# Patient Record
Sex: Male | Born: 1995 | Race: Black or African American | Hispanic: No | Marital: Single
Health system: Southern US, Community
[De-identification: ages and names within clinical notes are randomized; demographics above are authoritative.]

## PROBLEM LIST (undated history)

## (undated) ENCOUNTER — Ambulatory Visit: Source: Home / Self Care

## (undated) ENCOUNTER — Ambulatory Visit

## (undated) ENCOUNTER — Ambulatory Visit: Admission: EM | Payer: Medicaid Other | Source: Home / Self Care

## (undated) ENCOUNTER — Emergency Department (HOSPITAL_COMMUNITY): Payer: Self-pay | Source: Home / Self Care

## (undated) ENCOUNTER — Emergency Department (HOSPITAL_COMMUNITY)

---
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
## 2019-12-28 ENCOUNTER — Ambulatory Visit: Payer: Medicaid Other | Attending: Internal Medicine

## 2019-12-28 DIAGNOSIS — Z20822 Contact with and (suspected) exposure to covid-19: Secondary | ICD-10-CM

## 2019-12-29 LAB — NOVEL CORONAVIRUS, NAA: SARS-CoV-2, NAA: DETECTED — AB

## 2019-12-31 ENCOUNTER — Other Ambulatory Visit: Payer: Self-pay

## 2020-01-15 ENCOUNTER — Ambulatory Visit: Payer: Medicaid Other | Attending: Internal Medicine

## 2020-01-15 DIAGNOSIS — Z20822 Contact with and (suspected) exposure to covid-19: Secondary | ICD-10-CM

## 2020-01-16 LAB — NOVEL CORONAVIRUS, NAA: SARS-CoV-2, NAA: NOT DETECTED

## 2020-06-01 ENCOUNTER — Emergency Department (HOSPITAL_COMMUNITY)
Admission: EM | Admit: 2020-06-01 | Discharge: 2020-06-02 | Disposition: A | Discharge status: Home / Self Care | Payer: No Typology Code available for payment source | Attending: Emergency Medicine | Admitting: Emergency Medicine

## 2020-06-01 ENCOUNTER — Encounter (HOSPITAL_COMMUNITY): Payer: Self-pay | Admitting: Emergency Medicine

## 2020-06-01 ENCOUNTER — Other Ambulatory Visit: Payer: Self-pay

## 2020-06-01 ENCOUNTER — Emergency Department (HOSPITAL_COMMUNITY): Payer: No Typology Code available for payment source

## 2020-06-01 DIAGNOSIS — R519 Headache, unspecified: Secondary | ICD-10-CM | POA: Insufficient documentation

## 2020-06-01 DIAGNOSIS — M545 Low back pain, unspecified: Secondary | ICD-10-CM

## 2020-06-01 DIAGNOSIS — S50311A Abrasion of right elbow, initial encounter: Secondary | ICD-10-CM | POA: Insufficient documentation

## 2020-06-01 DIAGNOSIS — S59901A Unspecified injury of right elbow, initial encounter: Secondary | ICD-10-CM | POA: Diagnosis present

## 2020-06-01 DIAGNOSIS — Y998 Other external cause status: Secondary | ICD-10-CM | POA: Insufficient documentation

## 2020-06-01 DIAGNOSIS — Y92410 Unspecified street and highway as the place of occurrence of the external cause: Secondary | ICD-10-CM | POA: Insufficient documentation

## 2020-06-01 DIAGNOSIS — R079 Chest pain, unspecified: Secondary | ICD-10-CM | POA: Diagnosis not present

## 2020-06-01 DIAGNOSIS — M79632 Pain in left forearm: Secondary | ICD-10-CM

## 2020-06-01 DIAGNOSIS — M25571 Pain in right ankle and joints of right foot: Secondary | ICD-10-CM

## 2020-06-01 DIAGNOSIS — Y9389 Activity, other specified: Secondary | ICD-10-CM | POA: Diagnosis not present

## 2020-06-01 MED ORDER — NAPROXEN 500 MG PO TABS
500.0000 mg | ORAL_TABLET | Freq: Two times a day (BID) | ORAL | 0 refills | Status: DC
Start: 2020-06-01 — End: 2022-03-19

## 2020-06-01 MED ORDER — BACITRACIN ZINC 500 UNIT/GM EX OINT
TOPICAL_OINTMENT | Freq: Two times a day (BID) | CUTANEOUS | Status: DC
  Administered 2020-06-01: 1 via TOPICAL
  Filled 2020-06-01: qty 0.9

## 2020-06-01 MED ORDER — IBUPROFEN 800 MG PO TABS
800.0000 mg | ORAL_TABLET | Freq: Once | ORAL | Status: AC
  Administered 2020-06-01: 800 mg via ORAL
  Filled 2020-06-01: qty 1

## 2020-06-01 NOTE — Discharge Instructions (Signed)
Take naproxen 2 times a day with meals.  Do not take other anti-inflammatories at the same time (Advil, Motrin, ibuprofen, Aleve). You may supplement with Tylenol if you need further pain control. Use ice packs or heating pads if this helps control your pain. You will likely have continued muscle stiffness and soreness over the next couple days.  Follow-up with primary care in 1 week if your symptoms are not improving. Return to the emergency room if you develop vision changes, vomiting, slurred speech, numbness, loss of bowel or bladder control, or any new or worsening symptoms.
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
## 2020-09-22 ENCOUNTER — Other Ambulatory Visit: Payer: Medicaid Other

## 2021-01-06 ENCOUNTER — Ambulatory Visit (INDEPENDENT_AMBULATORY_CARE_PROVIDER_SITE_OTHER): Payer: Medicaid Other | Admitting: Primary Care

## 2021-02-15 ENCOUNTER — Encounter (HOSPITAL_COMMUNITY): Payer: Self-pay | Admitting: Emergency Medicine

## 2021-02-15 ENCOUNTER — Other Ambulatory Visit: Payer: Self-pay

## 2021-02-15 ENCOUNTER — Emergency Department (HOSPITAL_COMMUNITY)
Admission: EM | Admit: 2021-02-15 | Discharge: 2021-02-15 | Disposition: A | Discharge status: Home / Self Care | Payer: Medicaid Other | Attending: Emergency Medicine | Admitting: Emergency Medicine

## 2021-02-15 DIAGNOSIS — Z202 Contact with and (suspected) exposure to infections with a predominantly sexual mode of transmission: Secondary | ICD-10-CM | POA: Diagnosis not present

## 2021-02-15 DIAGNOSIS — Z711 Person with feared health complaint in whom no diagnosis is made: Secondary | ICD-10-CM

## 2021-02-15 LAB — URINALYSIS, ROUTINE W REFLEX MICROSCOPIC
Bacteria, UA: NONE SEEN
Glucose, UA: NEGATIVE mg/dL
Hgb urine dipstick: NEGATIVE
Ketones, ur: 5 mg/dL — AB
Leukocytes,Ua: NEGATIVE
Nitrite: NEGATIVE
Protein, ur: 30 mg/dL — AB
Specific Gravity, Urine: 1.042 — ABNORMAL HIGH (ref 1.005–1.030)
pH: 5 (ref 5.0–8.0)

## 2021-02-15 LAB — HIV ANTIBODY (ROUTINE TESTING W REFLEX): HIV Screen 4th Generation wRfx: NONREACTIVE

## 2021-02-15 NOTE — ED Provider Notes (Signed)
[facility]-EMERGENCY DEPT Provider Note   CSN: [id number] Arrival date & time: 02/15/21  1157     History Chief Complaint  Patient presents with  . Exposure to STD    Michael Swanson is a 25 y.o. male.  HPI   Patient presents today for STD testing.  Patient states he is currently having receptive anal intercourse with 2 other men.  Denies using protection.  One of his current partner states that he tested positive for an STD but patient is unsure of which STD this was.  Denies any symptoms at this time.  No chest pain, shortness of breath, abdominal pain, penile pain, testicular pain, penile swelling, testicular swelling, dysuria, hematuria, rectal pain, rectal discharge.     History reviewed. No pertinent past medical history.  There are no problems to display for this patient.   History reviewed. No pertinent surgical history.    History reviewed. No pertinent family history.  Social History   Tobacco Use  . Smoking status: Never Smoker  . Smokeless tobacco: Never Used  Substance Use Topics  . Alcohol use: Yes  . Drug use: Not Currently    Types: Marijuana    Home Medications Prior to Admission medications   Medication Sig Start Date End Date Taking? Authorizing Provider  naproxen (NAPROSYN) 500 MG tablet Take 1 tablet (500 mg total) by mouth 2 (two) times daily with a meal. 06/01/20   Caccavale, Sophia, PA-C    Allergies    Patient has no known allergies.  Review of Systems   Review of Systems  Respiratory: Negative for shortness of breath.   Cardiovascular: Negative for chest pain.  Gastrointestinal: Negative for abdominal pain, nausea and vomiting.  Genitourinary: Negative for decreased urine volume, difficulty urinating, dysuria, frequency, genital sores, hematuria, penile discharge, penile pain, penile swelling, scrotal swelling, testicular pain and urgency.   Physical Exam Updated Vital Signs BP (!) 148/71 (BP Location:  Right Arm)   Pulse 71   Temp 98.1 F (36.7 C) (Oral)   Resp 16   SpO2 98%   Physical Exam Vitals and nursing note reviewed.  Constitutional:      General: He is not in acute distress.    Appearance: He is well-developed.  HENT:     Head: Normocephalic and atraumatic.     Right Ear: External ear normal.     Left Ear: External ear normal.  Eyes:     General: No scleral icterus.       Right eye: No discharge.        Left eye: No discharge.     Conjunctiva/sclera: Conjunctivae normal.  Neck:     Trachea: No tracheal deviation.  Cardiovascular:     Rate and Rhythm: Normal rate.  Pulmonary:     Effort: Pulmonary effort is normal. No respiratory distress.     Breath sounds: No stridor.  Abdominal:     General: There is no distension.  Musculoskeletal:        General: No swelling or deformity.     Cervical back: Neck supple.  Skin:    General: Skin is warm and dry.     Findings: No rash.  Neurological:     Mental Status: He is alert.     Cranial Nerves: Cranial nerve deficit: no gross deficits.    ED Results / Procedures / Treatments   Labs (all labs ordered are listed, but only abnormal results are displayed) Labs Reviewed  URINALYSIS, ROUTINE W REFLEX MICROSCOPIC -  Abnormal; Notable for the following components:      Result Value   Color, Urine AMBER (*)    Specific Gravity, Urine 1.042 (*)    Bilirubin Urine SMALL (*)    Ketones, ur 5 (*)    Protein, ur 30 (*)    All other components within normal limits  HIV ANTIBODY (ROUTINE TESTING W REFLEX)  GC/CHLAMYDIA PROBE AMP ([place]) NOT AT [facility]   EKG None  Radiology No results found.  Procedures Procedures   Medications Ordered in ED Medications - No data to display  ED Course  I have reviewed the triage vital signs and the nursing notes.  Pertinent labs & imaging results that were available during my care of the patient were reviewed by me and considered in my medical decision making (see chart for  details).    MDM Rules/Calculators/A&P                          Patient is a 25 year old male who presents the emergency department for STD testing.  Currently asymptomatic.  Patient reports a history of receptive anal intercourse with 2 male partners.  Denies using protection.  UA reassuring.  HIV testing is pending.  GC/chlamydia is pending.  Patient is going to check the results of these tests on MyChart.  Recommended that he refrain from sex for the next 2 weeks and be sure to check these results.  Recommended condom usage and safe sex practices in the future.  Follow-up testing in the health department.  Discussed return precautions.  He knows that he needs to return to the emergency department for treatment if his tests are positive.  He verbalized understanding of the above plan.  His questions were answered and he was amicable at the time of discharge.  Final Clinical Impression(s) / ED Diagnoses Final diagnoses:  Concern about STD in male without diagnosis   Rx / DC Orders ED Discharge Orders    None       Placido Sou, PA-C 02/15/21 1538    Virgina Norfolk, DO 02/16/21 [phone]

## 2021-02-15 NOTE — Discharge Instructions (Addendum)
Like we discussed, please check the results of your HIV test as well as your gonorrhea and chlamydia test on MyChart.  You can access this information online.  This should be available in the next 1 to 2 days.  Please make sure that you refrain from having sex for the next 2 weeks.  If any of your results are positive, please return to the emergency department should you can be reevaluated as well as treated.  It was a pleasure to meet you.

## 2021-02-15 NOTE — ED Triage Notes (Addendum)
Patient reports a possible exposure to unknown STD. Denies any symptoms. Denies any urinary symptoms. States he has had 1 new partner in the last month. Denies any previous hx of std

## 2021-02-18 ENCOUNTER — Telehealth: Payer: Medicaid Other | Admitting: Internal Medicine

## 2021-02-18 ENCOUNTER — Other Ambulatory Visit: Payer: Self-pay

## 2021-03-02 ENCOUNTER — Encounter: Payer: Medicaid Other | Admitting: Internal Medicine

## 2021-08-11 ENCOUNTER — Encounter (HOSPITAL_COMMUNITY): Payer: Self-pay | Admitting: Emergency Medicine

## 2021-08-11 ENCOUNTER — Emergency Department (HOSPITAL_COMMUNITY)
Admission: EM | Admit: 2021-08-11 | Discharge: 2021-08-11 | Disposition: A | Discharge status: Home / Self Care | Payer: Medicaid Other | Attending: Emergency Medicine | Admitting: Emergency Medicine

## 2021-08-11 DIAGNOSIS — Z202 Contact with and (suspected) exposure to infections with a predominantly sexual mode of transmission: Secondary | ICD-10-CM | POA: Insufficient documentation

## 2021-08-11 DIAGNOSIS — A539 Syphilis, unspecified: Secondary | ICD-10-CM | POA: Insufficient documentation

## 2021-08-11 LAB — URINALYSIS, ROUTINE W REFLEX MICROSCOPIC
Bilirubin Urine: NEGATIVE
Glucose, UA: NEGATIVE mg/dL
Hgb urine dipstick: NEGATIVE
Ketones, ur: 5 mg/dL — AB
Leukocytes,Ua: NEGATIVE
Nitrite: NEGATIVE
Protein, ur: NEGATIVE mg/dL
Specific Gravity, Urine: 1.029 (ref 1.005–1.030)
pH: 5 (ref 5.0–8.0)

## 2021-08-11 NOTE — Discharge Instructions (Signed)
You were tested on today's visit for syphilis.  These results will not be available to you for the next 2 days.  In addition you were also tested for gonorrhea and chlamydia, these results will not be able for the next 48 hours.  The number to the health department is attached to your chart, should you seek any further STD testing.

## 2021-08-11 NOTE — ED Triage Notes (Addendum)
Patient states person he has been with tested positive for syphilis 2 days ago-wants to be tested-no symptoms, no pain

## 2021-08-11 NOTE — ED Provider Notes (Signed)
[facility]-EMERGENCY DEPT Provider Note   CSN: [id number] Arrival date & time: 08/11/21  1033     History Chief Complaint  Patient presents with   Exposure to STD    Michael Swanson is a 25 y.o. male.  25 y.o male with no PMH presents to the ED with concerns for syphilis.  Patient reports he was intimate with a person who tested positive for syphilis approximately 2 days ago.  He currently does not have any penile discharge, no rash, no testicular swelling, testicular pain.  No abdominal pain or fevers.  The history is provided by the patient.  Exposure to STD      Social History   Tobacco Use   Smoking status: Never   Smokeless tobacco: Never  Substance Use Topics   Alcohol use: Yes   Drug use: Not Currently    Types: Marijuana    Home Medications Prior to Admission medications   Medication Sig Start Date End Date Taking? Authorizing Provider  naproxen (NAPROSYN) 500 MG tablet Take 1 tablet (500 mg total) by mouth 2 (two) times daily with a meal. 06/01/20   Caccavale, Sophia, PA-C    Allergies    Patient has no known allergies.  Review of Systems   Review of Systems  Constitutional:  Negative for fever.  Genitourinary:  Negative for penile discharge, penile pain, penile swelling and scrotal swelling.   Physical Exam Updated Vital Signs BP 130/78 (BP Location: Left Arm)   Pulse 87   Temp 98.3 F (36.8 C) (Oral)   Resp 17   SpO2 99%   Physical Exam Vitals and nursing note reviewed.  Constitutional:      Appearance: He is well-developed.  HENT:     Head: Normocephalic and atraumatic.  Eyes:     General: No scleral icterus.    Pupils: Pupils are equal, round, and reactive to light.  Cardiovascular:     Heart sounds: Normal heart sounds.  Pulmonary:     Effort: Pulmonary effort is normal.     Breath sounds: Normal breath sounds. No wheezing.  Chest:     Chest wall: No tenderness.  Abdominal:     General: Bowel sounds are  normal. There is no distension.     Palpations: Abdomen is soft.     Tenderness: There is no abdominal tenderness.  Genitourinary:    Comments: Patient deferred. Musculoskeletal:        General: No tenderness or deformity.     Cervical back: Normal range of motion.  Skin:    General: Skin is warm and dry.  Neurological:     Mental Status: He is alert and oriented to person, place, and time.    ED Results / Procedures / Treatments   Labs (all labs ordered are listed, but only abnormal results are displayed) Labs Reviewed  URINALYSIS, ROUTINE W REFLEX MICROSCOPIC - Abnormal; Notable for the following components:      Result Value   Color, Urine AMBER (*)    Ketones, ur 5 (*)    All other components within normal limits  RPR  GC/CHLAMYDIA PROBE AMP ([place]) NOT AT [facility]    EKG None  Radiology No results found.  Procedures Procedures   Medications Ordered in ED Medications - No data to display  ED Course  I have reviewed the triage vital signs and the nursing notes.  Pertinent labs & imaging results that were available during my care of the patient were reviewed by me  and considered in my medical decision making (see chart for details).  Clinical Course as of 08/11/21 1225  Tue Aug 11, 2021  1220 Color, Urine(!): AMBER [JS]    Clinical Course User Index [JS] Claude Manges, PA-C   MDM Rules/Calculators/A&P   Patient presents to the ED requesting syphilis testing.  Exposed to a known person after intimacy who tested +2 days ago for syphilis.  He does not have any complaints at this time.  No penile discharge, no rash, no testicular swelling, testicular pain, no abdominal pain.   UA today without any nitrites or leukocytes.  RPR has been collected, GC and Chlamydia were also sent.  He is informed that these results will return within 48 hours.  He continues to be asymptomatic at this time.  Patient discharged in stable condition.   Portions of this note were  generated with Scientist, clinical (histocompatibility and immunogenetics). Dictation errors may occur despite best attempts at proofreading.  Final Clinical Impression(s) / ED Diagnoses Final diagnoses:  Exposure to STD    Rx / DC Orders ED Discharge Orders     None        Claude Manges, PA-C 08/11/21 1225    Mancel Bale, MD 08/11/21 [phone]

## 2021-08-12 LAB — RPR
RPR Ser Ql: REACTIVE — AB
RPR Titer: 1:4 {titer}

## 2021-08-14 LAB — T.PALLIDUM AB, TOTAL: T Pallidum Abs: REACTIVE — AB
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
## 2022-02-13 ENCOUNTER — Other Ambulatory Visit: Payer: Self-pay

## 2022-02-13 ENCOUNTER — Encounter: Payer: Self-pay | Admitting: Emergency Medicine

## 2022-02-13 ENCOUNTER — Ambulatory Visit
Admission: EM | Admit: 2022-02-13 | Discharge: 2022-02-13 | Disposition: A | Discharge status: Home / Self Care | Payer: Medicaid Other | Attending: Physician Assistant | Admitting: Physician Assistant

## 2022-02-13 DIAGNOSIS — K047 Periapical abscess without sinus: Secondary | ICD-10-CM

## 2022-02-13 MED ORDER — AMOXICILLIN 500 MG PO CAPS: 500.0000 mg | ORAL_CAPSULE | Freq: Three times a day (TID) | ORAL | 0 refills | Status: DC

## 2022-02-13 NOTE — ED Triage Notes (Signed)
Pt sts left upper dental pain x 2 days with some swelling this am ?

## 2022-02-13 NOTE — ED Provider Notes (Signed)
?[facility] ? ? ? ?CSN: [id number] ?Arrival date & time: 02/13/22  2863 ? ? ?  ? ?History   ?Chief Complaint ?Chief Complaint  ?Patient presents with  ? Dental Pain  ? ? ?HPI ?Michael Swanson is a 26 y.o. male.  ? ?Patient here today for evaluation of left upper dental pain that is been ongoing for the last 2 days.  He reports this morning he woke with swelling in that area and has a harder time opening his mouth fully due to pain.  He has not taken any medication for symptoms.  He denies any fever, nausea or vomiting. ? ?The history is provided by the patient.  ? ?History reviewed. No pertinent past medical history. ? ?There are no problems to display for this patient. ? ? ?History reviewed. No pertinent surgical history. ? ? ? ? ?Home Medications   ? ?Prior to Admission medications   ?Medication Sig Start Date End Date Taking? Authorizing Provider  ?amoxicillin (AMOXIL) 500 MG capsule Take 1 capsule (500 mg total) by mouth 3 (three) times daily. 02/13/22  Yes Tomi Bamberger, PA-C  ?naproxen (NAPROSYN) 500 MG tablet Take 1 tablet (500 mg total) by mouth 2 (two) times daily with a meal. 06/01/20   Caccavale, Sophia, PA-C  ? ? ?Family History ?History reviewed. No pertinent family history. ? ?Social History ?Social History  ? ?Tobacco Use  ? Smoking status: Never  ? Smokeless tobacco: Never  ?Substance Use Topics  ? Alcohol use: Yes  ? Drug use: Not Currently  ?  Types: Marijuana  ? ? ? ?Allergies   ?Patient has no known allergies. ? ? ?Review of Systems ?Review of Systems  ?Constitutional:  Negative for chills and fever.  ?HENT:  Positive for dental problem and facial swelling.   ?Eyes:  Negative for discharge and redness.  ?Gastrointestinal:  Negative for nausea and vomiting.  ? ? ?Physical Exam ?Triage Vital Signs ?ED Triage Vitals  ?Enc Vitals Group  ?   BP   ?   Pulse   ?   Resp   ?   Temp   ?   Temp src   ?   SpO2   ?   Weight   ?   Height   ?   Head Circumference   ?   Peak Flow   ?   Pain Score    ?   Pain Loc   ?   Pain Edu?   ?   Excl. in GC?   ? ?No data found. ? ?Updated Vital Signs ?BP (!) 146/84 (BP Location: Left Arm)   Pulse 97   Temp 99.3 ?F (37.4 ?C) (Oral)   Resp 18   SpO2 96%  ?   ? ?Physical Exam ?Vitals and nursing note reviewed.  ?Constitutional:   ?   General: He is not in acute distress. ?   Appearance: Normal appearance. He is not ill-appearing.  ?HENT:  ?   Head: Normocephalic and atraumatic.  ?   Mouth/Throat:  ?   Comments: Mild swelling noted to left lateral maxillary area, patient unable to fully open mouth due to swelling and pain, gingival swelling noted to left upper back molars ?Eyes:  ?   Conjunctiva/sclera: Conjunctivae normal.  ?Cardiovascular:  ?   Rate and Rhythm: Normal rate.  ?Pulmonary:  ?   Effort: Pulmonary effort is normal.  ?Neurological:  ?   Mental Status: He is alert.  ?Psychiatric:     ?  Mood and Affect: Mood normal.     ?   Behavior: Behavior normal.     ?   Thought Content: Thought content normal.  ? ? ? ?UC Treatments / Results  ?Labs ?(all labs ordered are listed, but only abnormal results are displayed) ?Labs Reviewed - No data to display ? ?EKG ? ? ?Radiology ?No results found. ? ?Procedures ?Procedures (including critical care time) ? ?Medications Ordered in UC ?Medications - No data to display ? ?Initial Impression / Assessment and Plan / UC Course  ?I have reviewed the triage vital signs and the nursing notes. ? ?Pertinent labs & imaging results that were available during my care of the patient were reviewed by me and considered in my medical decision making (see chart for details). ? ?  ?Amoxicillin prescribed to cover dental abscess.  Recommended ibuprofen if needed for pain and swelling.  Recommended further evaluation in the emergency department if symptoms fail to improve with oral treatment or worsen in any way. ? ?Final Clinical Impressions(s) / UC Diagnoses  ? ?Final diagnoses:  ?Dental abscess  ? ?Discharge Instructions   ?None ?  ? ?ED  Prescriptions   ? ? Medication Sig Dispense Auth. Provider  ? amoxicillin (AMOXIL) 500 MG capsule Take 1 capsule (500 mg total) by mouth 3 (three) times daily. 21 capsule Tomi Bamberger, PA-C  ? ?  ? ?PDMP not reviewed this encounter. ?  ?Tomi Bamberger, PA-C ?02/13/22 1009 ? ?

## 2022-03-19 ENCOUNTER — Ambulatory Visit
Admission: EM | Admit: 2022-03-19 | Discharge: 2022-03-19 | Disposition: A | Discharge status: Home / Self Care | Payer: Medicaid Other | Attending: Student | Admitting: Student

## 2022-03-19 DIAGNOSIS — L209 Atopic dermatitis, unspecified: Secondary | ICD-10-CM

## 2022-03-19 MED ORDER — HYDROXYZINE HCL 25 MG PO TABS: 25.0000 mg | ORAL_TABLET | Freq: Every evening | ORAL | 0 refills | Status: DC | PRN

## 2022-03-19 MED ORDER — PREDNISONE 50 MG PO TABS: 50.0000 mg | ORAL_TABLET | Freq: Every day | ORAL | 0 refills | Status: AC

## 2022-03-19 NOTE — ED Triage Notes (Signed)
Pt c/o possible allergic reaction onset "sometime next week" states has rash through both arms, legs, back, chest, abd. States it is very itchy. Tried benadryl at home without relief.  ?

## 2022-03-19 NOTE — Discharge Instructions (Addendum)
-  Prednisone one pill with breakfast or lunch x5 days. Can cause energy.  ?-Hydroxyzine for itching. This will make you sleepy so take at bedtime.  ?-Seek additional immediate medical attention if symptoms get worse, including new shortness of breath, new wheezing, new facial swelling, new sensation of throat closing. ?

## 2022-03-19 NOTE — ED Provider Notes (Signed)
?[facility] ? ? ? ?CSN: [id number] ?Arrival date & time: 03/19/22  1847 ? ? ?  ? ?History   ?Chief Complaint ?Chief Complaint  ?Patient presents with  ? Rash  ? ? ?HPI ?Michael Swanson is a 26 y.o. male presenting with concern for rash for 7 days.  History noncontributory, denies history of eczema in the past.  Describes pruritic rash that started on the arms and spread to the abdomen and legs as well.  Feeling well otherwise, denies shortness of breath, sensation of throat closing, facial swelling, dizziness.  Has attempted Benadryl without relief. Denies new products or outdoor exposure. ? ?HPI ? ?History reviewed. No pertinent past medical history. ? ?There are no problems to display for this patient. ? ? ?History reviewed. No pertinent surgical history. ? ? ? ? ?Home Medications   ? ?Prior to Admission medications   ?Medication Sig Start Date End Date Taking? Authorizing Provider  ?hydrOXYzine (ATARAX) 25 MG tablet Take 1 tablet (25 mg total) by mouth at bedtime as needed. 03/19/22  Yes Hazel Sams, PA-C  ?predniSONE (DELTASONE) 50 MG tablet Take 1 tablet (50 mg total) by mouth daily for 5 days. Take with breakfast or lunch. Avoid NSAIDs (ibuprofen, etc) while taking this medication. 03/19/22 03/24/22 Yes Hazel Sams, PA-C  ? ? ?Family History ?History reviewed. No pertinent family history. ? ?Social History ?Social History  ? ?Tobacco Use  ? Smoking status: Never  ? Smokeless tobacco: Never  ?Substance Use Topics  ? Alcohol use: Yes  ? Drug use: Not Currently  ?  Types: Marijuana  ? ? ? ?Allergies   ?Patient has no known allergies. ? ? ?Review of Systems ?Review of Systems  ?Skin:  Positive for rash.  ?All other systems reviewed and are negative. ? ? ?Physical Exam ?Triage Vital Signs ?ED Triage Vitals  ?Enc Vitals Group  ?   BP 03/19/22 1901 125/79  ?   Pulse Rate 03/19/22 1901 80  ?   Resp 03/19/22 1901 18  ?   Temp 03/19/22 1901 98.2 ?F (36.8 ?C)  ?   Temp Source 03/19/22 1901 Oral  ?    SpO2 03/19/22 1901 99 %  ?   Weight --   ?   Height --   ?   Head Circumference --   ?   Peak Flow --   ?   Pain Score 03/19/22 1902 0  ?   Pain Loc --   ?   Pain Edu? --   ?   Excl. in [place]? --   ? ?No data found. ? ?Updated Vital Signs ?BP 125/79 (BP Location: Left Arm)   Pulse 80   Temp 98.2 ?F (36.8 ?C) (Oral)   Resp 18   SpO2 99%  ? ?Visual Acuity ?Right Eye Distance:   ?Left Eye Distance:   ?Bilateral Distance:   ? ?Right Eye Near:   ?Left Eye Near:    ?Bilateral Near:    ? ?Physical Exam ?Vitals reviewed.  ?Constitutional:   ?   General: He is not in acute distress. ?   Appearance: Normal appearance. He is not ill-appearing.  ?HENT:  ?   Head: Normocephalic and atraumatic.  ?Pulmonary:  ?   Effort: Pulmonary effort is normal.  ?Skin: ?   Comments: Few patches of skin colored sandpaper papules. No erythema, warmth, swelling. No facial, lip, tongue, uvula swelling. Airway patent.  ?Neurological:  ?   General: No focal deficit present.  ?  Mental Status: He is alert and oriented to person, place, and time.  ?Psychiatric:     ?   Mood and Affect: Mood normal.     ?   Behavior: Behavior normal.     ?   Thought Content: Thought content normal.     ?   Judgment: Judgment normal.  ? ? ? ?UC Treatments / Results  ?Labs ?(all labs ordered are listed, but only abnormal results are displayed) ?Labs Reviewed - No data to display ? ?EKG ? ? ?Radiology ?No results found. ? ?Procedures ?Procedures (including critical care time) ? ?Medications Ordered in UC ?Medications - No data to display ? ?Initial Impression / Assessment and Plan / UC Course  ?I have reviewed the triage vital signs and the nursing notes. ? ?Pertinent labs & imaging results that were available during my care of the patient were reviewed by me and considered in my medical decision making (see chart for details). ? ?  ? ?This patient is a very pleasant 26 y.o. year old male presenting with atopic dermatitis. Reassuring exam. Prednisone and hydroxyzine  sent. ED return precautions discussed. Patient verbalizes understanding and agreement.  ?  ? ?Final Clinical Impressions(s) / UC Diagnoses  ? ?Final diagnoses:  ?Atopic dermatitis, unspecified type  ? ? ? ?Discharge Instructions   ? ?  ?-Prednisone one pill with breakfast or lunch x5 days. Can cause energy.  ?-Hydroxyzine for itching. This will make you sleepy so take at bedtime.  ?-Seek additional immediate medical attention if symptoms get worse, including new shortness of breath, new wheezing, new facial swelling, new sensation of throat closing. ? ? ?ED Prescriptions   ? ? Medication Sig Dispense Auth. Provider  ? predniSONE (DELTASONE) 50 MG tablet Take 1 tablet (50 mg total) by mouth daily for 5 days. Take with breakfast or lunch. Avoid NSAIDs (ibuprofen, etc) while taking this medication. 5 tablet Hazel Sams, PA-C  ? hydrOXYzine (ATARAX) 25 MG tablet Take 1 tablet (25 mg total) by mouth at bedtime as needed. 12 tablet Hazel Sams, PA-C  ? ?  ? ?PDMP not reviewed this encounter. ?  ?Hazel Sams, PA-C ?03/19/22 1927 ? ?

## 2022-03-28 ENCOUNTER — Encounter (HOSPITAL_COMMUNITY): Payer: Self-pay

## 2022-03-28 ENCOUNTER — Emergency Department (HOSPITAL_COMMUNITY)
Admission: EM | Admit: 2022-03-28 | Discharge: 2022-03-28 | Disposition: A | Discharge status: Home / Self Care | Payer: Medicaid Other | Attending: Emergency Medicine | Admitting: Emergency Medicine

## 2022-03-28 DIAGNOSIS — R234 Changes in skin texture: Secondary | ICD-10-CM | POA: Insufficient documentation

## 2022-03-28 NOTE — ED Triage Notes (Signed)
Pt presents with c/o hands and toes peeling. Pt reports it has been present for approx one week. ?

## 2022-03-28 NOTE — Discharge Instructions (Addendum)
You were seen in the emergency department for peeling hands and feet. ? ?The most common cause of this is increased moisture due to gloves and socks. I recommend using baby powder on the affected areas before putting on gloves and socks. While at home, try not to have anything on your hands and feet. You can use lotion or emollients (vaseline, aquaphor) on any area that's really giving you trouble, but try not to do that before putting on gloves. ? ?I've attached the contact info for the dermatologist for you to call and make an appointment if your symptoms continue.  ?

## 2022-03-28 NOTE — ED Provider Notes (Signed)
?[facility]-EMERGENCY DEPT ?Provider Note ? ? ?CSN: [id number] ?Arrival date & time: 03/28/22  1212 ? ?  ? ?History ? ?Chief Complaint  ?Patient presents with  ? Skin Peeling  ? ? ?Michael Swanson is a 26 y.o. male who presents to the emergency department for peeling hands and feet starting 1 week ago.  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  He also wears boots for the whole shift.  No other rashes.  He just started using hand cream 2 days ago and says there is been no relief. No other complaints. No other changes in lotions, detergents, or soaps.  ? ?HPI ? ?  ? ?Home Medications ?Prior to Admission medications   ?Medication Sig Start Date End Date Taking? Authorizing Provider  ?hydrOXYzine (ATARAX) 25 MG tablet Take 1 tablet (25 mg total) by mouth at bedtime as needed. 03/19/22   Rhys Martini, PA-C  ?   ? ?Allergies    ?Patient has no known allergies.   ? ?Review of Systems   ?Review of Systems  ?Constitutional:  Negative for fever.  ?Skin:  Positive for rash. Negative for color change and wound.  ?All other systems reviewed and are negative. ? ?Physical Exam ?Updated Vital Signs ?BP 138/84 (BP Location: Left Arm)   Pulse 88   Temp 98.2 ?F (36.8 ?C) (Oral)   Resp 18   SpO2 96%  ?Physical Exam ?Vitals and nursing note reviewed.  ?Constitutional:   ?   Appearance: Normal appearance.  ?HENT:  ?   Head: Normocephalic and atraumatic.  ?Eyes:  ?   Conjunctiva/sclera: Conjunctivae normal.  ?Pulmonary:  ?   Effort: Pulmonary effort is normal. No respiratory distress.  ?Skin: ?   General: Skin is warm and dry.  ?   Comments: Peeling of the intertriginous spaces of digits, palms, and palmar aspect of feet. No overlying erythema or signs of cellulitis. No other lesions.   ?Neurological:  ?   Mental Status: He is alert.  ?Psychiatric:     ?   Mood and Affect: Mood normal.     ?   Behavior: Behavior normal.  ? ? ?ED Results /  Procedures / Treatments   ?Labs ?(all labs ordered are listed, but only abnormal results are displayed) ?Labs Reviewed - No data to display ? ?EKG ?None ? ?Radiology ?No results found. ? ?Procedures ?Procedures  ? ? ?Medications Ordered in ED ?Medications - No data to display ? ?ED Course/ Medical Decision Making/ A&P ?  ?                        ?Medical Decision Making ?This patient is a 26 year old male who presents to the ED for concern of peeling hands and feet.  ? ?Differential diagnoses prior to evaluation: ?Intertrigo, peeling skin syndrome, superficial burn ? ?Past Medical History / Co-morbidities / Social History: ?None ? ?Physical Exam: ?Physical exam performed. The pertinent findings include: Peeling of the intertriginous spaces of digits, palms, and palmar aspect of feet. No overlying erythema or signs of cellulitis. No other lesions.  ?  ?Disposition: ?After consideration of the diagnostic results and the patients response to treatment, I feel that patient's symptoms are likely due to moisture from gloves and tight shoes. I recommended using baby powder to assist in keeping this areas dry as well as changing gloves throughout the shift. Encouraged  patient to follow up with dermatology if symptoms persist. Discussed reasons to return to the emergency department, and the patient is agreeable to the plan.  ? ?Final Clinical Impression(s) / ED Diagnoses ?Final diagnoses:  ?Peeling skin  ? ? ?Rx / DC Orders ?ED Discharge Orders   ? ? None  ? ?  ? ?Portions of this report may have been transcribed using voice recognition software. Every effort was made to ensure accuracy; however, inadvertent computerized transcription errors may be present. ? ?  ?Su Monks, PA-C ?03/28/22 1244 ? ?  ?Jacalyn Lefevre, MD ?04/01/22 6160 ? ?

## 2022-03-29 ENCOUNTER — Ambulatory Visit: Payer: Self-pay

## 2022-07-22 ENCOUNTER — Ambulatory Visit
Admission: EM | Admit: 2022-07-22 | Discharge: 2022-07-22 | Disposition: A | Discharge status: Home / Self Care | Payer: Medicaid Other | Attending: Physician Assistant | Admitting: Physician Assistant

## 2022-07-22 DIAGNOSIS — M546 Pain in thoracic spine: Secondary | ICD-10-CM

## 2022-07-22 MED ORDER — CYCLOBENZAPRINE HCL 10 MG PO TABS: 10.0000 mg | ORAL_TABLET | Freq: Two times a day (BID) | ORAL | 0 refills | Status: DC | PRN

## 2022-07-22 MED ORDER — PREDNISONE 20 MG PO TABS: 40.0000 mg | ORAL_TABLET | Freq: Every day | ORAL | 0 refills | Status: AC
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
## 2022-08-04 ENCOUNTER — Ambulatory Visit
Admission: EM | Admit: 2022-08-04 | Discharge: 2022-08-04 | Disposition: A | Discharge status: Home / Self Care | Payer: Medicaid Other | Attending: Family Medicine | Admitting: Family Medicine

## 2022-08-04 DIAGNOSIS — Z113 Encounter for screening for infections with a predominantly sexual mode of transmission: Secondary | ICD-10-CM | POA: Diagnosis present

## 2022-08-04 NOTE — ED Triage Notes (Signed)
Pt here for routine sti screening. Denies symptoms or known exposure.

## 2022-08-04 NOTE — Discharge Instructions (Signed)
We have sent testing for sexually transmitted infections. We will notify you of any positive results once they are received. If required, we will prescribe any medications you might need.  Please refrain from all sexual activity for at least the next seven days.  

## 2022-08-05 LAB — CYTOLOGY, (ORAL, ANAL, URETHRAL) ANCILLARY ONLY
Chlamydia: NEGATIVE
Comment: NEGATIVE
Comment: NEGATIVE
Comment: NORMAL
Neisseria Gonorrhea: NEGATIVE
Trichomonas: NEGATIVE

## 2022-08-07 NOTE — ED Provider Notes (Signed)
  [facility]   [id number] 08/04/22 Arrival Time: 0814  ASSESSMENT & PLAN:  1. Screening for STDs (sexually transmitted diseases)    No empiric tx desired.    Discharge Instructions      We have sent testing for sexually transmitted infections. We will notify you of any positive results once they are received. If required, we will prescribe any medications you might need.  Please refrain from all sexual activity for at least the next seven days.     Pending: Labs Reviewed  CYTOLOGY, (ORAL, ANAL, URETHRAL) ANCILLARY ONLY    Will notify of any positive results. Instructed to refrain from sexual activity for at least seven days.  Reviewed expectations re: course of current medical issues. Questions answered. Outlined signs and symptoms indicating need for more acute intervention. Patient verbalized understanding. After Visit Summary given.   SUBJECTIVE:  Michael Swanson is a 26 y.o. male who is here for routine sti screening. Denies symptoms or known exposure.  No penile discharge. Is sexually active; male partner.   OBJECTIVE:  Vitals:   08/04/22 0821  BP: 128/83  Pulse: 64  Resp: 18  Temp: 97.6 F (36.4 C)  TempSrc: Oral  SpO2: 98%    General appearance: alert, cooperative, appears stated age and no distress Throat: lips, mucosa, and tongue normal; teeth and gums normal Abdomen: soft, non-tender GU: deferred Skin: warm and dry Psychological: alert and cooperative; normal mood and affect.    Labs Reviewed  CYTOLOGY, (ORAL, ANAL, URETHRAL) ANCILLARY ONLY    No Known Allergies  History reviewed. No pertinent past medical history. Family History  Family history unknown: Yes   Social History   Socioeconomic History   Marital status: Single    Spouse name: Not on file   Number of children: Not on file   Years of education: Not on file   Highest education level: Not on file  Occupational History   Not on file  Tobacco Use    Smoking status: Never   Smokeless tobacco: Never  Substance and Sexual Activity   Alcohol use: Yes   Drug use: Not Currently    Types: Marijuana   Sexual activity: Yes    Birth control/protection: None  Other Topics Concern   Not on file  Social History Narrative   Not on file   Social Determinants of Health   Financial Resource Strain: Not on file  Food Insecurity: Not on file  Transportation Needs: Not on file  Physical Activity: Not on file  Stress: Not on file  Social Connections: Not on file  Intimate Partner Violence: Not on file           Mardella Layman, MD 08/07/22 1047

## 2022-08-23 ENCOUNTER — Ambulatory Visit
Admission: EM | Admit: 2022-08-23 | Discharge: 2022-08-23 | Disposition: A | Discharge status: Home / Self Care | Payer: Medicaid Other

## 2022-08-23 DIAGNOSIS — R1030 Lower abdominal pain, unspecified: Secondary | ICD-10-CM | POA: Diagnosis not present

## 2022-08-23 NOTE — ED Provider Notes (Signed)
[facility]    CSN: [id number] Arrival date & time: 08/23/22  1107      History   Chief Complaint Chief Complaint  Patient presents with   Abdominal Pain    HPI Michael Swanson is a 26 y.o. male.   Here today for evaluation of diffuse generalized lower abdominal pain that is been ongoing for the last 2 days.  He reports that he thinks symptoms might of started after he ate from a restaurant locally.  He denies any fever and has not had any nausea or vomiting.  He has not had any diarrhea.  He denies any blood in his stool or dark tarry stools.  The history is provided by the patient.    History reviewed. No pertinent past medical history.  There are no problems to display for this patient.   History reviewed. No pertinent surgical history.     Home Medications    Prior to Admission medications   Not on File    Family History Family History  Family history unknown: Yes    Social History Social History   Tobacco Use   Smoking status: Never   Smokeless tobacco: Never  Substance Use Topics   Alcohol use: Yes   Drug use: Not Currently    Types: Marijuana     Allergies   Shrimp extract allergy skin test   Review of Systems Review of Systems  Constitutional:  Negative for chills and fever.  Eyes:  Negative for discharge and redness.  Respiratory:  Negative for shortness of breath.   Gastrointestinal:  Positive for abdominal pain. Negative for blood in stool, diarrhea, nausea and vomiting.  Neurological:  Negative for numbness.     Physical Exam Triage Vital Signs ED Triage Vitals  Enc Vitals Group     BP      Pulse      Resp      Temp      Temp src      SpO2      Weight      Height      Head Circumference      Peak Flow      Pain Score      Pain Loc      Pain Edu?      Excl. in [place]?    No data found.  Updated Vital Signs BP 125/79 (BP Location: Right Arm)   Pulse (!) 59   Temp 98 F (36.7 C) (Oral)   Resp 18    SpO2 97%      Physical Exam Vitals and nursing note reviewed.  Constitutional:      General: He is not in acute distress.    Appearance: Normal appearance. He is not ill-appearing.  HENT:     Head: Normocephalic and atraumatic.  Eyes:     Conjunctiva/sclera: Conjunctivae normal.  Cardiovascular:     Rate and Rhythm: Normal rate and regular rhythm.  Pulmonary:     Effort: Pulmonary effort is normal. No respiratory distress.     Breath sounds: Normal breath sounds. No wheezing, rhonchi or rales.  Abdominal:     General: Abdomen is flat. Bowel sounds are normal. There is no distension.     Tenderness: There is abdominal tenderness (mild TTP to lower abdomen diffusely). There is no guarding or rebound.  Neurological:     Mental Status: He is alert.  Psychiatric:        Mood and Affect: Mood normal.  Behavior: Behavior normal.        Thought Content: Thought content normal.      UC Treatments / Results  Labs (all labs ordered are listed, but only abnormal results are displayed) Labs Reviewed - No data to display  EKG   Radiology No results found.  Procedures Procedures (including critical care time)  Medications Ordered in UC Medications - No data to display  Initial Impression / Assessment and Plan / UC Course  I have reviewed the triage vital signs and the nursing notes.  Pertinent labs & imaging results that were available during my care of the patient were reviewed by me and considered in my medical decision making (see chart for details).    Suspect benign nature of reported abdominal pain.  Recommended further evaluation in the emergency department for imaging and labs should symptoms worsen in any way.  Patient expresses understanding.  Encouraged bland diet and increase fluids and follow-up with any further concerns.  Final Clinical Impressions(s) / UC Diagnoses   Final diagnoses:  Lower abdominal pain   Discharge Instructions   None    ED  Prescriptions   None    PDMP not reviewed this encounter.   Tomi Bamberger, PA-C 08/23/22 1224

## 2022-08-23 NOTE — ED Triage Notes (Signed)
Pt presents with generalized abdominal pain X 2 days.

## 2022-12-14 ENCOUNTER — Ambulatory Visit
Admission: EM | Admit: 2022-12-14 | Discharge: 2022-12-14 | Disposition: A | Discharge status: Home / Self Care | Payer: Medicaid Other | Attending: Physician Assistant | Admitting: Physician Assistant

## 2022-12-14 DIAGNOSIS — Z1152 Encounter for screening for COVID-19: Secondary | ICD-10-CM | POA: Diagnosis present

## 2022-12-14 DIAGNOSIS — J069 Acute upper respiratory infection, unspecified: Secondary | ICD-10-CM | POA: Insufficient documentation

## 2022-12-14 NOTE — ED Triage Notes (Signed)
Pt presents with non productive cough, nasal drainage and headache X 3 days.

## 2022-12-14 NOTE — ED Provider Notes (Signed)
[facility]    CSN: [id number] Arrival date & time: 12/14/22  0841      History   Chief Complaint Chief Complaint  Patient presents with   URI   Headache    HPI Ajahni Denzell Colasanti is a 27 y.o. male.   Patient here today for evaluation of nonproductive cough, nasal drainage and headache he has had the last 3 days. He is unsure of fever. He denies any vomiting or diarrhea. He has tried mucinex and theraflu with mild relief. He notes multiple people at work have been sick.   The history is provided by the patient.    History reviewed. No pertinent past medical history.  There are no problems to display for this patient.   History reviewed. No pertinent surgical history.     Home Medications    Prior to Admission medications   Not on File    Family History Family History  Family history unknown: Yes    Social History Social History   Tobacco Use   Smoking status: Never   Smokeless tobacco: Never  Substance Use Topics   Alcohol use: Yes   Drug use: Not Currently    Types: Marijuana     Allergies   Shrimp extract allergy skin test   Review of Systems Review of Systems  Constitutional:  Negative for fever.  HENT:  Positive for congestion and sore throat. Negative for ear pain.   Eyes:  Negative for discharge and redness.  Respiratory:  Positive for cough. Negative for shortness of breath.   Gastrointestinal:  Negative for diarrhea, nausea and vomiting.  Neurological:  Positive for headaches.     Physical Exam Triage Vital Signs ED Triage Vitals  Enc Vitals Group     BP      Pulse      Resp      Temp      Temp src      SpO2      Weight      Height      Head Circumference      Peak Flow      Pain Score      Pain Loc      Pain Edu?      Excl. in [place]?    No data found.  Updated Vital Signs BP 119/84 (BP Location: Left Arm)   Pulse 89   Temp 98.7 F (37.1 C) (Oral)   Resp 17   SpO2 96%      Physical Exam Vitals  and nursing note reviewed.  Constitutional:      General: He is not in acute distress.    Appearance: Normal appearance. He is not ill-appearing.  HENT:     Head: Normocephalic and atraumatic.     Nose: Congestion present.     Mouth/Throat:     Mouth: Mucous membranes are moist.     Pharynx: Oropharynx is clear. No oropharyngeal exudate or posterior oropharyngeal erythema.  Eyes:     Conjunctiva/sclera: Conjunctivae normal.  Cardiovascular:     Rate and Rhythm: Normal rate and regular rhythm.     Heart sounds: Normal heart sounds. No murmur heard. Pulmonary:     Effort: Pulmonary effort is normal. No respiratory distress.     Breath sounds: Normal breath sounds. No wheezing, rhonchi or rales.  Skin:    General: Skin is warm and dry.  Neurological:     Mental Status: He is alert.  Psychiatric:  Mood and Affect: Mood normal.        Thought Content: Thought content normal.      UC Treatments / Results  Labs (all labs ordered are listed, but only abnormal results are displayed) Labs Reviewed  SARS CORONAVIRUS 2 (TAT 6-24 HRS)    EKG   Radiology No results found.  Procedures Procedures (including critical care time)  Medications Ordered in UC Medications - No data to display  Initial Impression / Assessment and Plan / UC Course  I have reviewed the triage vital signs and the nursing notes.  Pertinent labs & imaging results that were available during my care of the patient were reviewed by me and considered in my medical decision making (see chart for details).    Suspect viral etiology of symptoms.  Will screen for COVID.  Recommend continued over-the-counter medication for symptom relief and follow-up if no gradual improvement or with any worsening.  Encouraged to increase fluids and rest.  Final Clinical Impressions(s) / UC Diagnoses   Final diagnoses:  Acute upper respiratory infection  Encounter for screening for COVID-19   Discharge Instructions    None    ED Prescriptions   None    PDMP not reviewed this encounter.   Francene Finders, PA-C 12/14/22 1211

## 2022-12-15 LAB — SARS CORONAVIRUS 2 (TAT 6-24 HRS): SARS Coronavirus 2: POSITIVE — AB

## 2022-12-22 ENCOUNTER — Ambulatory Visit
Admission: EM | Admit: 2022-12-22 | Discharge: 2022-12-22 | Disposition: A | Discharge status: Home / Self Care | Payer: Medicaid Other

## 2022-12-22 DIAGNOSIS — R1030 Lower abdominal pain, unspecified: Secondary | ICD-10-CM | POA: Diagnosis not present

## 2022-12-22 DIAGNOSIS — R197 Diarrhea, unspecified: Secondary | ICD-10-CM

## 2022-12-22 NOTE — ED Triage Notes (Signed)
Pt presents with generalized abdominal pain X 2 days with no complaints of N/V/D

## 2022-12-22 NOTE — ED Provider Notes (Signed)
[facility]    CSN: [id number] Arrival date & time: 12/22/22  1118      History   Chief Complaint Chief Complaint  Patient presents with   Abdominal Pain    HPI Michael Swanson is a 27 y.o. male.   Patient here today for evaluation of generalized lower abdominal pain that he has had for the last few days.  He denies any vomiting or nausea but has had some diarrhea.  He states about 4-5 bowel movements daily.  He states he initially did have some blood in his stool but this is cleared.  He did recently recover from [place] and is not sure if the symptoms are related.  The history is provided by the patient.  Abdominal Pain Associated symptoms: diarrhea   Associated symptoms: no chills, no cough, no fever, no nausea, no shortness of breath and no vomiting     History reviewed. No pertinent past medical history.  There are no problems to display for this patient.   History reviewed. No pertinent surgical history.     Home Medications    Prior to Admission medications   Not on File    Family History Family History  Family history unknown: Yes    Social History Social History   Tobacco Use   Smoking status: Never   Smokeless tobacco: Never  Substance Use Topics   Alcohol use: Yes   Drug use: Not Currently    Types: Marijuana     Allergies   Shrimp extract allergy skin test   Review of Systems Review of Systems  Constitutional:  Negative for chills and fever.  HENT:  Negative for congestion and rhinorrhea.   Eyes:  Negative for discharge and redness.  Respiratory:  Negative for cough and shortness of breath.   Gastrointestinal:  Positive for abdominal pain and diarrhea. Negative for blood in stool, nausea and vomiting.     Physical Exam Triage Vital Signs ED Triage Vitals  Enc Vitals Group     BP 12/22/22 1312 109/76     Pulse Rate 12/22/22 1312 90     Resp 12/22/22 1312 16     Temp 12/22/22 1312 98.2 F (36.8 C)     Temp  Source 12/22/22 1312 Oral     SpO2 12/22/22 1312 98 %     Weight --      Height --      Head Circumference --      Peak Flow --      Pain Score 12/22/22 1122 6     Pain Loc --      Pain Edu? --      Excl. in [place]? --    No data found.  Updated Vital Signs BP 109/76 (BP Location: Left Arm)   Pulse 90   Temp 98.2 F (36.8 C) (Oral)   Resp 16   SpO2 98%   Physical Exam Vitals and nursing note reviewed.  Constitutional:      General: He is not in acute distress.    Appearance: Normal appearance. He is not ill-appearing.  HENT:     Head: Normocephalic and atraumatic.  Eyes:     Conjunctiva/sclera: Conjunctivae normal.  Cardiovascular:     Rate and Rhythm: Normal rate and regular rhythm.     Heart sounds: Normal heart sounds.  Pulmonary:     Effort: Pulmonary effort is normal. No respiratory distress.     Breath sounds: Normal breath sounds. No wheezing, rhonchi or rales.  Abdominal:     General: Abdomen is flat. Bowel sounds are normal. There is no distension.     Palpations: Abdomen is soft.     Tenderness: There is no abdominal tenderness. There is no guarding.  Neurological:     Mental Status: He is alert.  Psychiatric:        Mood and Affect: Mood normal.        Behavior: Behavior normal.        Thought Content: Thought content normal.      UC Treatments / Results  Labs (all labs ordered are listed, but only abnormal results are displayed) Labs Reviewed - No data to display  EKG   Radiology No results found.  Procedures Procedures (including critical care time)  Medications Ordered in UC Medications - No data to display  Initial Impression / Assessment and Plan / UC Course  I have reviewed the triage vital signs and the nursing notes.  Pertinent labs & imaging results that were available during my care of the patient were reviewed by me and considered in my medical decision making (see chart for details).    Suspect diarrhea related to recent  viral illness. Encouraged bland diet, increased fluids and follow up if no gradual improvement.   Final Clinical Impressions(s) / UC Diagnoses   Final diagnoses:  Lower abdominal pain  Diarrhea, unspecified type   Discharge Instructions   None    ED Prescriptions   None    PDMP not reviewed this encounter.   Francene Finders, PA-C 12/22/22 1355

## 2023-02-25 ENCOUNTER — Ambulatory Visit (HOSPITAL_COMMUNITY)
Admission: EM | Admit: 2023-02-25 | Discharge: 2023-02-25 | Disposition: A | Discharge status: Home / Self Care | Payer: Medicaid Other

## 2023-04-07 ENCOUNTER — Other Ambulatory Visit: Payer: Self-pay

## 2023-04-07 ENCOUNTER — Ambulatory Visit
Admission: EM | Admit: 2023-04-07 | Discharge: 2023-04-07 | Disposition: A | Discharge status: Home / Self Care | Payer: Medicaid Other | Attending: Internal Medicine | Admitting: Internal Medicine

## 2023-04-07 ENCOUNTER — Encounter: Payer: Self-pay | Admitting: Emergency Medicine

## 2023-04-07 DIAGNOSIS — Z113 Encounter for screening for infections with a predominantly sexual mode of transmission: Secondary | ICD-10-CM

## 2023-04-07 NOTE — Discharge Instructions (Signed)
Your STD testing is pending.  Will call if it is positive and treat as appropriate if necessary.  Please refrain from sexual activity until test results and treatment are complete.

## 2023-04-07 NOTE — ED Triage Notes (Signed)
Patient requesting STI testing including lab work.  Denies any sx's.

## 2023-04-07 NOTE — ED Provider Notes (Signed)
[facility]    CSN: [id number] Arrival date & time: 04/07/23  1010      History   Chief Complaint Chief Complaint  Patient presents with   STI testing    HPI Anson Tyric Rodeheaver is a 27 y.o. male.   Patient presents today for STD testing.  He denies any exposure or any associated symptoms.  Reports that he has had unprotected intercourse with a single male partner.     History reviewed. No pertinent past medical history.  There are no problems to display for this patient.   History reviewed. No pertinent surgical history.     Home Medications    Prior to Admission medications   Not on File    Family History Family History  Family history unknown: Yes    Social History Social History   Tobacco Use   Smoking status: Never   Smokeless tobacco: Never  Vaping Use   Vaping Use: Never used  Substance Use Topics   Alcohol use: Yes   Drug use: Not Currently    Types: Marijuana     Allergies   Shrimp extract   Review of Systems Review of Systems Per HPI  Physical Exam Triage Vital Signs ED Triage Vitals  Enc Vitals Group     BP 04/07/23 1029 126/75     Pulse Rate 04/07/23 1029 66     Resp 04/07/23 1029 16     Temp 04/07/23 1029 98.9 F (37.2 C)     Temp Source 04/07/23 1029 Oral     SpO2 04/07/23 1029 99 %     Weight 04/07/23 1031 160 lb (72.6 kg)     Height 04/07/23 1031  (1.727 m)     Head Circumference --      Peak Flow --      Pain Score 04/07/23 1031 0     Pain Loc --      Pain Edu? --      Excl. in [place]? --    No data found.  Updated Vital Signs BP 126/75 (BP Location: Right Arm)   Pulse 66   Temp 98.9 F (37.2 C) (Oral)   Resp 16   Ht  (1.727 m)   Wt 160 lb (72.6 kg)   SpO2 99%   BMI 24.33 kg/m   Visual Acuity Right Eye Distance:   Left Eye Distance:   Bilateral Distance:    Right Eye Near:   Left Eye Near:    Bilateral Near:     Physical Exam Constitutional:      General: He is not  in acute distress.    Appearance: Normal appearance. He is not toxic-appearing or diaphoretic.  HENT:     Head: Normocephalic and atraumatic.  Eyes:     Extraocular Movements: Extraocular movements intact.     Conjunctiva/sclera: Conjunctivae normal.  Pulmonary:     Effort: Pulmonary effort is normal.  Genitourinary:    Comments: Deferred with shared decision making. Self swab performed.  Neurological:     General: No focal deficit present.     Mental Status: He is alert and oriented to person, place, and time. Mental status is at baseline.  Psychiatric:        Mood and Affect: Mood normal.        Behavior: Behavior normal.        Thought Content: Thought content normal.        Judgment: Judgment normal.      UC Treatments /  Results  Labs (all labs ordered are listed, but only abnormal results are displayed) Labs Reviewed  RPR  HIV ANTIBODY (ROUTINE TESTING W REFLEX)  CYTOLOGY, (ORAL, ANAL, URETHRAL) ANCILLARY ONLY    EKG   Radiology No results found.  Procedures Procedures (including critical care time)  Medications Ordered in UC Medications - No data to display  Initial Impression / Assessment and Plan / UC Course  I have reviewed the triage vital signs and the nursing notes.  Pertinent labs & imaging results that were available during my care of the patient were reviewed by me and considered in my medical decision making (see chart for details).     Cytology swab, HIV, RPR test pending.  Awaiting result.  Given no confirmed exposure, do not need treatment at this time.  Patient verbalized understanding and was agreeable with plan. Final Clinical Impressions(s) / UC Diagnoses   Final diagnoses:  Screening examination for venereal disease     Discharge Instructions      Your STD testing is pending.  Will call if it is positive and treat as appropriate if necessary.  Please refrain from sexual activity until test results and treatment are  complete.    ED Prescriptions   None    PDMP not reviewed this encounter.   Gustavus Bryant, [place] 04/07/23 1043

## 2023-04-08 LAB — CYTOLOGY, (ORAL, ANAL, URETHRAL) ANCILLARY ONLY
Chlamydia: NEGATIVE
Comment: NEGATIVE
Comment: NEGATIVE
Comment: NORMAL
Neisseria Gonorrhea: NEGATIVE
Trichomonas: NEGATIVE

## 2023-04-10 LAB — HIV ANTIBODY (ROUTINE TESTING W REFLEX): HIV Screen 4th Generation wRfx: NONREACTIVE

## 2023-04-10 LAB — RPR, QUANT+TP ABS (REFLEX)
Rapid Plasma Reagin, Quant: 1:32 {titer} — ABNORMAL HIGH
T Pallidum Abs: REACTIVE — AB

## 2023-04-10 LAB — RPR: RPR Ser Ql: REACTIVE — AB

## 2023-04-11 ENCOUNTER — Other Ambulatory Visit: Payer: Self-pay

## 2023-04-11 ENCOUNTER — Telehealth (HOSPITAL_COMMUNITY): Payer: Self-pay | Admitting: Emergency Medicine

## 2023-04-11 NOTE — Telephone Encounter (Signed)
Per Rolly Salter, APP "Patient's RPR and titers were positive.  It appears that patient's titers were 1:4 approximately 1 to 2 years ago so this is an increase.  Therefore, it appears that patient will need current treatment.  Attempted to call patient to notify of results but there was no answer.  Left voicemail per privacy policy.  Awaiting callback. "  Patient is aware and will be returning

## 2023-04-12 ENCOUNTER — Ambulatory Visit
Admission: EM | Admit: 2023-04-12 | Discharge: 2023-04-12 | Disposition: A | Discharge status: Home / Self Care | Payer: Medicaid Other | Attending: Nurse Practitioner | Admitting: Nurse Practitioner

## 2023-04-12 DIAGNOSIS — A539 Syphilis, unspecified: Secondary | ICD-10-CM

## 2023-04-12 MED ORDER — PENICILLIN G BENZATHINE 1200000 UNIT/2ML IM SUSY
2.4000 10*6.[IU] | PREFILLED_SYRINGE | Freq: Once | INTRAMUSCULAR | Status: AC
  Administered 2023-04-12: 2.4 10*6.[IU] via INTRAMUSCULAR

## 2023-04-12 NOTE — ED Triage Notes (Signed)
Pt states he is here for treatment for syphilis was called yesterday to return.

## 2023-06-17 ENCOUNTER — Ambulatory Visit
Admission: EM | Admit: 2023-06-17 | Discharge: 2023-06-17 | Disposition: A | Discharge status: Home / Self Care | Payer: Medicaid Other

## 2023-06-17 DIAGNOSIS — R519 Headache, unspecified: Secondary | ICD-10-CM | POA: Diagnosis not present

## 2023-06-17 NOTE — Discharge Instructions (Signed)
TYlenol for discomfort °
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
## 2023-07-19 ENCOUNTER — Ambulatory Visit
Admission: EM | Admit: 2023-07-19 | Discharge: 2023-07-19 | Disposition: A | Discharge status: Home / Self Care | Payer: Medicaid Other | Attending: Family Medicine | Admitting: Family Medicine

## 2023-07-19 DIAGNOSIS — Z202 Contact with and (suspected) exposure to infections with a predominantly sexual mode of transmission: Secondary | ICD-10-CM | POA: Insufficient documentation

## 2023-07-19 DIAGNOSIS — Z1152 Encounter for screening for COVID-19: Secondary | ICD-10-CM | POA: Insufficient documentation

## 2023-07-19 NOTE — ED Triage Notes (Signed)
Patient here today to be tested for Covid. Patient states that his mother tested positive for Covid and he wanted to be tested. He is not having any symptoms.

## 2023-07-19 NOTE — Discharge Instructions (Signed)
  You have been swabbed for COVID, and the test will result in the next 24 hours. Our staff will call you if positive. If the COVID test is positive, you should quarantine until you are fever free for 24 hours and you are starting to feel better, and then take added precautions for the next 5 days, such as physical distancing/wearing a mask and good hand hygiene/washing.

## 2023-07-19 NOTE — ED Provider Notes (Signed)
[facility]    CSN: [id number] Arrival date & time: 07/19/23  0926      History   Chief Complaint Chief Complaint  Patient presents with   Covid Test    HPI Michael Swanson is a 27 y.o. male.   HPI Here for exposure to COVID.  His mother tested +2 days ago for COVID.  He was exposed to her last night.  His job is requiring that he be COVID-negative before he returns to work.   this patient is not having any referable symptoms.   no cough congestion or fever or headache or nausea or vomiting or diarrhea or change in taste or smell  Chronic conditions; no allergies to medications History reviewed. No pertinent past medical history.  There are no problems to display for this patient.   History reviewed. No pertinent surgical history.     Home Medications    Prior to Admission medications   Not on File    Family History Family History  Family history unknown: Yes    Social History Social History   Tobacco Use   Smoking status: Never   Smokeless tobacco: Never  Vaping Use   Vaping status: Never Used  Substance Use Topics   Alcohol use: Yes   Drug use: Not Currently    Types: Marijuana     Allergies   Shrimp extract   Review of Systems Review of Systems   Physical Exam Triage Vital Signs ED Triage Vitals [07/19/23 1014]  Encounter Vitals Group     BP 134/89     Systolic BP Percentile      Diastolic BP Percentile      Pulse Rate 67     Resp 16     Temp 97.9 F (36.6 C)     Temp Source Oral     SpO2 98 %     Weight 160 lb (72.6 kg)     Height 5\' 8"  (1.727 m)     Head Circumference      Peak Flow      Pain Score 0     Pain Loc      Pain Education      Exclude from Growth Chart    No data found.  Updated Vital Signs BP 134/89 (BP Location: Right Arm)   Pulse 67   Temp 97.9 F (36.6 C) (Oral)   Resp 16   Ht 5\' 8"  (1.727 m)   Wt 72.6 kg   SpO2 98%   BMI 24.33 kg/m   Visual Acuity Right Eye Distance:    Left Eye Distance:   Bilateral Distance:    Right Eye Near:   Left Eye Near:    Bilateral Near:     Physical Exam Vitals reviewed.  Constitutional:      General: He is not in acute distress.    Appearance: He is not ill-appearing, toxic-appearing or diaphoretic.  HENT:     Mouth/Throat:     Mouth: Mucous membranes are moist.  Eyes:     Extraocular Movements: Extraocular movements intact.     Pupils: Pupils are equal, round, and reactive to light.  Cardiovascular:     Rate and Rhythm: Normal rate and regular rhythm.     Heart sounds: No murmur heard. Pulmonary:     Effort: Pulmonary effort is normal.     Breath sounds: Normal breath sounds.  Skin:    Coloration: Skin is not pale.  Neurological:     Mental Status:  He is alert and oriented to person, place, and time.  Psychiatric:        Behavior: Behavior normal.      UC Treatments / Results  Labs (all labs ordered are listed, but only abnormal results are displayed) Labs Reviewed  SARS CORONAVIRUS 2 (TAT 6-24 HRS)    EKG   Radiology No results found.  Procedures Procedures (including critical care time)  Medications Ordered in UC Medications - No data to display  Initial Impression / Assessment and Plan / UC Course  I have reviewed the triage vital signs and the nursing notes.  Pertinent labs & imaging results that were available during my care of the patient were reviewed by me and considered in my medical decision making (see chart for details).        COVID swab is done today.  Will notify him of positive.  He was supplied a work note, since he will not have the test results until after he is due to be at work again Final Clinical Impressions(s) / UC Diagnoses   Final diagnoses:  Exposure to STD     Discharge Instructions       You have been swabbed for COVID, and the test will result in the next 24 hours. Our staff will call you if positive. If the COVID test is positive, you should  quarantine until you are fever free for 24 hours and you are starting to feel better, and then take added precautions for the next 5 days, such as physical distancing/wearing a mask and good hand hygiene/washing.      ED Prescriptions   None    PDMP not reviewed this encounter.   Zenia Resides, MD 07/19/23 1024

## 2024-02-07 ENCOUNTER — Ambulatory Visit
Admission: EM | Admit: 2024-02-07 | Discharge: 2024-02-07 | Disposition: A | Discharge status: Home / Self Care | Payer: Medicaid Other | Attending: Family Medicine | Admitting: Family Medicine

## 2024-02-07 DIAGNOSIS — A530 Latent syphilis, unspecified as early or late: Secondary | ICD-10-CM

## 2024-02-07 NOTE — Discharge Instructions (Signed)
 Please return with your blood work results so we can determine if you need any further treatment

## 2024-02-07 NOTE — ED Triage Notes (Signed)
"  I need to get a shot for Syphillis, per [facility] due to it coming back + after giving blood". No current known concerns. No rash. No penis discharge.

## 2024-02-07 NOTE — ED Provider Notes (Addendum)
 [facility]    CSN: [id number] Arrival date & time: 02/07/24  1203      History   Chief Complaint Chief Complaint  Patient presents with   SEXUALLY TRANSMITTED DISEASE    HPI Michael Swanson is a 28 y.o. male.   HPI Here for positive syphilis test on blood work done when he was at [employer].  He left the report at home.  He does not know the titer on it.  April 2024, he tested positive for syphilis with a 1:32 titer.  He received penicillin treatment on April 30 of last year.  He has not had repeat blood work until now.  No rash or fevers.  No weight loss and no penile discharge or dysuria. History reviewed. No pertinent past medical history.  There are no active problems to display for this patient.   History reviewed. No pertinent surgical history.     Home Medications    Prior to Admission medications   Not on File    Family History Family History  Family history unknown: Yes    Social History Social History   Tobacco Use   Smoking status: Never    Passive exposure: Never   Smokeless tobacco: Never  Vaping Use   Vaping status: Never Used  Substance Use Topics   Alcohol use: Yes    Comment: Socially.   Drug use: Not Currently    Types: Marijuana     Allergies   Shrimp extract   Review of Systems Review of Systems   Physical Exam Triage Vital Signs ED Triage Vitals  Encounter Vitals Group     BP 02/07/24 1251 118/80     Systolic BP Percentile --      Diastolic BP Percentile --      Pulse Rate 02/07/24 1251 74     Resp 02/07/24 1251 18     Temp 02/07/24 1251 98 F (36.7 C)     Temp Source 02/07/24 1251 Oral     SpO2 02/07/24 1251 97 %     Weight 02/07/24 1248 175 lb (79.4 kg)     Height 02/07/24 1248 5\' 8"  (1.727 m)     Head Circumference --      Peak Flow --      Pain Score 02/07/24 1245 0     Pain Loc --      Pain Education --      Exclude from Growth Chart --    No data found.  Updated Vital  Signs BP 118/80 (BP Location: Left Arm)   Pulse 74   Temp 98 F (36.7 C) (Oral)   Resp 18   Ht 5\' 8"  (1.727 m)   Wt 79.4 kg   SpO2 97%   BMI 26.61 kg/m   Visual Acuity Right Eye Distance:   Left Eye Distance:   Bilateral Distance:    Right Eye Near:   Left Eye Near:    Bilateral Near:     Physical Exam Vitals reviewed.  Constitutional:      General: He is not in acute distress.    Appearance: He is not toxic-appearing.  Cardiovascular:     Rate and Rhythm: Normal rate and regular rhythm.  Skin:    Coloration: Skin is not pale.     Findings: No rash.  Neurological:     Mental Status: He is alert and oriented to person, place, and time.  Psychiatric:        Behavior: Behavior normal.  UC Treatments / Results  Labs (all labs ordered are listed, but only abnormal results are displayed) Labs Reviewed - No data to display   EKG   Radiology No results found.  Procedures Procedures (including critical care time)  Medications Ordered in UC Medications - No data to display  Initial Impression / Assessment and Plan / UC Course  I have reviewed the triage vital signs and the nursing notes.  Pertinent labs & imaging results that were available during my care of the patient were reviewed by me and considered in my medical decision making (see chart for details).   We discussed how we would hope that the titer had fallen since he was treated for syphilis in April 2024.  He decided to go and have [place] redraw of the blood work.  The RPR is redrawn today and we will notify him if it looks like he needs retreatment for the syphilis.  He has just had an HIV done so he does not need that repeated.  He declines to do urethral swab at this time.   He then decided that he would prefer to bring the blood work results back.  He will bring those back and we will let him know if he needs retreatment. Final Clinical Impressions(s) / UC Diagnoses   Final diagnoses:  Positive  RPR test     Discharge Instructions      Please return with your blood work results so we can determine if you need any further treatment     ED Prescriptions   None    PDMP not reviewed this encounter.   Zenia Resides, MD 02/07/24 1329    Zenia Resides, MD 02/07/24 1330    Zenia Resides, MD 02/07/24 [phone]

## 2024-05-10 ENCOUNTER — Ambulatory Visit

## 2024-06-23 ENCOUNTER — Inpatient Hospital Stay
Admission: EM | Admit: 2024-06-23 | Discharge: 2024-06-23 | Discharge status: Home / Self Care | Attending: Physician Assistant

## 2024-06-23 VITALS — BP 121/73 | HR 75 | Temp 98.6°F | Resp 18 | Wt 175.0 lb

## 2024-06-23 DIAGNOSIS — Z113 Encounter for screening for infections with a predominantly sexual mode of transmission: Secondary | ICD-10-CM | POA: Diagnosis present

## 2024-06-23 NOTE — ED Provider Notes (Signed)
 [facility]    CSN: [id number] Arrival date & time: 06/23/24  0844      History   Chief Complaint Chief Complaint  Patient presents with   SEXUALLY TRANSMITTED DISEASE    HPI Elster Loranzo Desha is a 28 y.o. male.   Patient presents for STD testing.  He reports no symptoms at this time.  He does have a history of syphilis, tested positive in April.  Completed treatment.    History reviewed. No pertinent past medical history.  There are no active problems to display for this patient.   History reviewed. No pertinent surgical history.     Home Medications    Prior to Admission medications   Not on File    Family History Family History  Family history unknown: Yes    Social History Social History   Tobacco Use   Smoking status: Never    Passive exposure: Never   Smokeless tobacco: Never  Vaping Use   Vaping status: Never Used  Substance Use Topics   Alcohol use: Yes    Comment: Socially.   Drug use: Not Currently    Types: Marijuana     Allergies   Shrimp extract   Review of Systems Review of Systems  Constitutional:  Negative for chills and fever.  HENT:  Negative for ear pain and sore throat.   Eyes:  Negative for pain and visual disturbance.  Respiratory:  Negative for cough and shortness of breath.   Cardiovascular:  Negative for chest pain and palpitations.  Gastrointestinal:  Negative for abdominal pain and vomiting.  Genitourinary:  Negative for dysuria and hematuria.  Musculoskeletal:  Negative for arthralgias and back pain.  Skin:  Negative for color change and rash.  Neurological:  Negative for seizures and syncope.  All other systems reviewed and are negative.    Physical Exam Triage Vital Signs ED Triage Vitals  Encounter Vitals Group     BP 06/23/24 0857 121/73     Girls Systolic BP Percentile --      Girls Diastolic BP Percentile --      Boys Systolic BP Percentile --      Boys Diastolic BP Percentile --       Pulse Rate 06/23/24 0857 75     Resp 06/23/24 0857 18     Temp 06/23/24 0857 98.6 F (37 C)     Temp Source 06/23/24 0857 Oral     SpO2 06/23/24 0857 97 %     Weight 06/23/24 0855 175 lb 0.7 oz (79.4 kg)     Height --      Head Circumference --      Peak Flow --      Pain Score 06/23/24 0854 0     Pain Loc --      Pain Education --      Exclude from Growth Chart --    No data found.  Updated Vital Signs BP 121/73 (BP Location: Left Arm)   Pulse 75   Temp 98.6 F (37 C) (Oral)   Resp 18   Wt 175 lb 0.7 oz (79.4 kg)   SpO2 97%   BMI 26.62 kg/m   Visual Acuity Right Eye Distance:   Left Eye Distance:   Bilateral Distance:    Right Eye Near:   Left Eye Near:    Bilateral Near:     Physical Exam Vitals and nursing note reviewed.  Constitutional:      General: He is not in acute  distress.    Appearance: He is well-developed.  HENT:     Head: Normocephalic and atraumatic.  Eyes:     Conjunctiva/sclera: Conjunctivae normal.  Cardiovascular:     Rate and Rhythm: Normal rate and regular rhythm.     Heart sounds: No murmur heard. Pulmonary:     Effort: Pulmonary effort is normal. No respiratory distress.     Breath sounds: Normal breath sounds.  Abdominal:     Palpations: Abdomen is soft.     Tenderness: There is no abdominal tenderness.  Musculoskeletal:        General: No swelling.     Cervical back: Neck supple.  Skin:    General: Skin is warm and dry.     Capillary Refill: Capillary refill takes less than 2 seconds.  Neurological:     Mental Status: He is alert.  Psychiatric:        Mood and Affect: Mood normal.      UC Treatments / Results  Labs (all labs ordered are listed, but only abnormal results are displayed) Labs Reviewed  CYTOLOGY, (ORAL, ANAL, URETHRAL) ANCILLARY ONLY    EKG   Radiology No results found.  Procedures Procedures (including critical care time)  Medications Ordered in UC Medications - No data to  display  Initial Impression / Assessment and Plan / UC Course  I have reviewed the triage vital signs and the nursing notes.  Pertinent labs & imaging results that were available during my care of the patient were reviewed by me and considered in my medical decision making (see chart for details).     Cytology today, results pending will treat if indicated based on results.  Patient defers testing for HIV or syphilis today reports he will come back at another time for this. Final Clinical Impressions(s) / UC Diagnoses   Final diagnoses:  Screen for STD (sexually transmitted disease)     Discharge Instructions      Will call with test results if positive.     ED Prescriptions   None    PDMP not reviewed this encounter.   Ward, Harlene PEDLAR, PA-C 06/23/24 [id number]

## 2024-06-23 NOTE — ED Triage Notes (Signed)
 Pt presents requesting STI testing. Pt denies any sxs but says he want to be sure everything is okay. Last sexual encounter 1 week ago.

## 2024-06-23 NOTE — Discharge Instructions (Signed)
 Will call with test results if positive.

## 2024-06-25 LAB — CYTOLOGY, (ORAL, ANAL, URETHRAL) ANCILLARY ONLY
Chlamydia: NEGATIVE
Comment: NEGATIVE
Comment: NEGATIVE
Comment: NORMAL
Neisseria Gonorrhea: POSITIVE — AB
Trichomonas: NEGATIVE

## 2024-06-26 ENCOUNTER — Ambulatory Visit (HOSPITAL_COMMUNITY): Payer: Self-pay

## 2024-06-28 ENCOUNTER — Encounter: Payer: Self-pay | Admitting: Emergency Medicine

## 2024-06-28 ENCOUNTER — Ambulatory Visit
Admission: EM | Admit: 2024-06-28 | Discharge: 2024-06-28 | Disposition: A | Discharge status: Home / Self Care | Attending: Physician Assistant | Admitting: Physician Assistant

## 2024-06-28 DIAGNOSIS — A549 Gonococcal infection, unspecified: Secondary | ICD-10-CM | POA: Diagnosis not present

## 2024-06-28 MED ORDER — CEFTRIAXONE SODIUM 500 MG IJ SOLR
500.0000 mg | INTRAMUSCULAR | Status: DC
  Administered 2024-06-28: 500 mg via INTRAMUSCULAR

## 2024-06-28 NOTE — ED Triage Notes (Signed)
 Pt presents for treatment only. Pt was notified to come into UC for Rocephin  500 mg.
